# Patient Record
Sex: Male | Born: 2000 | Race: Black or African American | Hispanic: No | Marital: Single | Smoking: Never smoker
Health system: Southern US, Community
[De-identification: ages and names within clinical notes are randomized; demographics above are authoritative.]

---
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
## 2022-06-14 ENCOUNTER — Ambulatory Visit (HOSPITAL_COMMUNITY): Payer: Medicaid Other
